# Patient Record
Sex: Male | Born: 1960 | Race: White | Hispanic: No | Marital: Married
Health system: Southern US, Community
[De-identification: ages and names within clinical notes are randomized; demographics above are authoritative.]

---

## 2005-04-25 ENCOUNTER — Ambulatory Visit: Payer: Self-pay

## 2005-05-20 ENCOUNTER — Ambulatory Visit: Payer: Self-pay | Admitting: Unknown Physician Specialty

## 2005-12-05 ENCOUNTER — Emergency Department: Payer: Self-pay | Admitting: Emergency Medicine

## 2007-12-04 ENCOUNTER — Ambulatory Visit: Payer: Self-pay | Admitting: Gastroenterology
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
## 2010-09-15 ENCOUNTER — Emergency Department: Payer: Self-pay | Admitting: Emergency Medicine

## 2020-12-20 ENCOUNTER — Emergency Department: Payer: No Typology Code available for payment source

## 2020-12-20 ENCOUNTER — Emergency Department
Admission: EM | Admit: 2020-12-20 | Discharge: 2020-12-20 | Disposition: A | Discharge status: Home / Self Care | Payer: No Typology Code available for payment source | Attending: Emergency Medicine | Admitting: Emergency Medicine

## 2020-12-20 ENCOUNTER — Other Ambulatory Visit: Payer: Self-pay

## 2020-12-20 DIAGNOSIS — S70311A Abrasion, right thigh, initial encounter: Secondary | ICD-10-CM | POA: Diagnosis not present

## 2020-12-20 DIAGNOSIS — S0081XA Abrasion of other part of head, initial encounter: Secondary | ICD-10-CM | POA: Insufficient documentation

## 2020-12-20 DIAGNOSIS — S60512A Abrasion of left hand, initial encounter: Secondary | ICD-10-CM | POA: Diagnosis not present

## 2020-12-20 DIAGNOSIS — S0990XA Unspecified injury of head, initial encounter: Secondary | ICD-10-CM | POA: Diagnosis present

## 2020-12-20 DIAGNOSIS — M546 Pain in thoracic spine: Secondary | ICD-10-CM | POA: Diagnosis not present

## 2020-12-20 DIAGNOSIS — Y92410 Unspecified street and highway as the place of occurrence of the external cause: Secondary | ICD-10-CM | POA: Diagnosis not present

## 2020-12-20 MED ORDER — MELOXICAM 15 MG PO TABS: 15.0000 mg | ORAL_TABLET | Freq: Every day | ORAL | 2 refills | Status: AC

## 2020-12-20 MED ORDER — MUPIROCIN CALCIUM 2 % EX CREA: 1.0000 "application " | TOPICAL_CREAM | Freq: Two times a day (BID) | CUTANEOUS | 0 refills | Status: AC
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
## 2020-12-20 NOTE — Discharge Instructions (Addendum)
Take meloxicam once daily for pain and inflammation. Apply mupirocin twice daily to left hand.
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
